# Patient Record
Sex: Female | Born: 1960 | Marital: Married | Smoking: Never smoker
Health system: Southern US, Community
[De-identification: ages and names within clinical notes are randomized; demographics above are authoritative.]

## PROBLEM LIST (undated history)

## (undated) DIAGNOSIS — E785 Hyperlipidemia, unspecified: Secondary | ICD-10-CM

## (undated) HISTORY — PX: ABDOMINAL HYSTERECTOMY: SHX81

---

## 2004-09-05 ENCOUNTER — Ambulatory Visit: Payer: Self-pay

## 2005-07-19 ENCOUNTER — Ambulatory Visit: Payer: Self-pay | Admitting: Internal Medicine

## 2006-04-04 ENCOUNTER — Ambulatory Visit: Payer: Self-pay | Admitting: Internal Medicine

## 2006-04-18 ENCOUNTER — Ambulatory Visit: Payer: Self-pay | Admitting: Internal Medicine

## 2006-05-11 ENCOUNTER — Ambulatory Visit: Payer: Self-pay | Admitting: Internal Medicine

## 2006-06-01 IMAGING — US US RENAL KIDNEY
1 series · 17 of 25 positions shown · non-contrast
Comparison: none

REASON FOR EXAM: Left flank pain
COMMENTS:

[Series 1: us renal kidney · 17 of 32 slices shown]
[im 1/32]
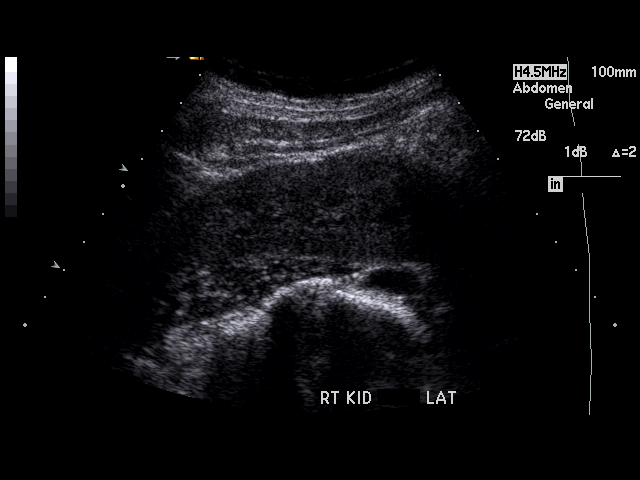
[im 3/32]
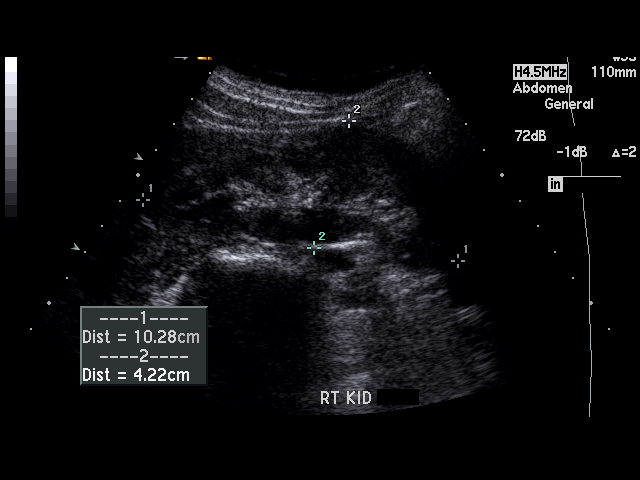
[im 4/32]
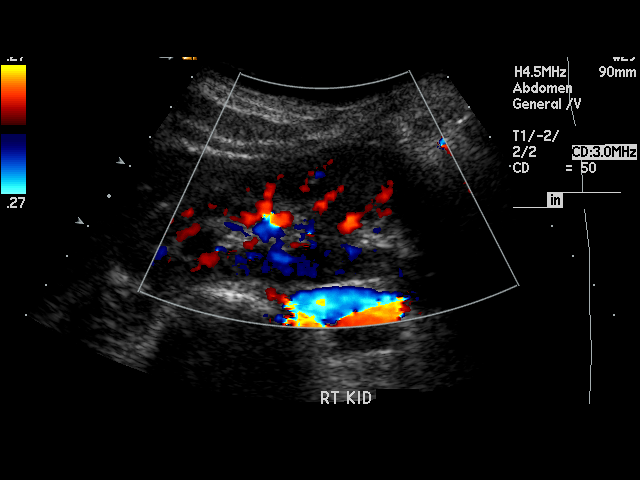
[im 7/32]
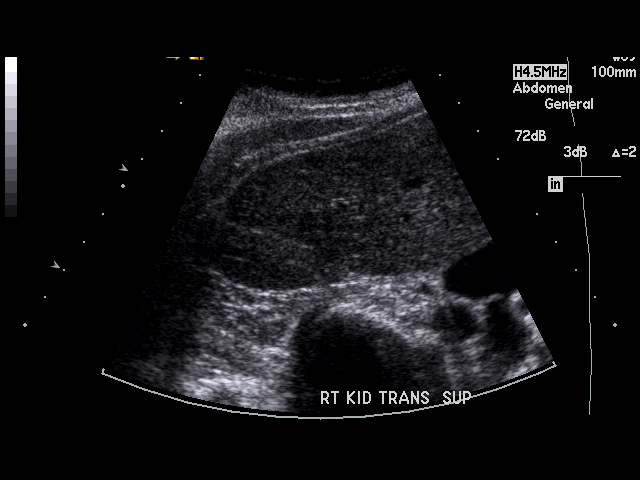
[im 8/32]
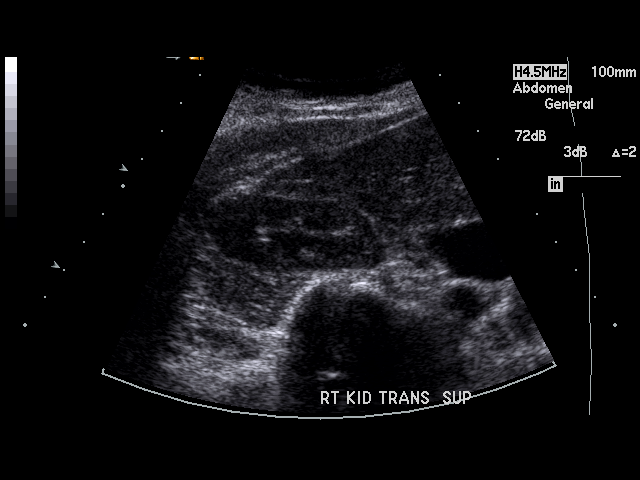
[im 11/32]
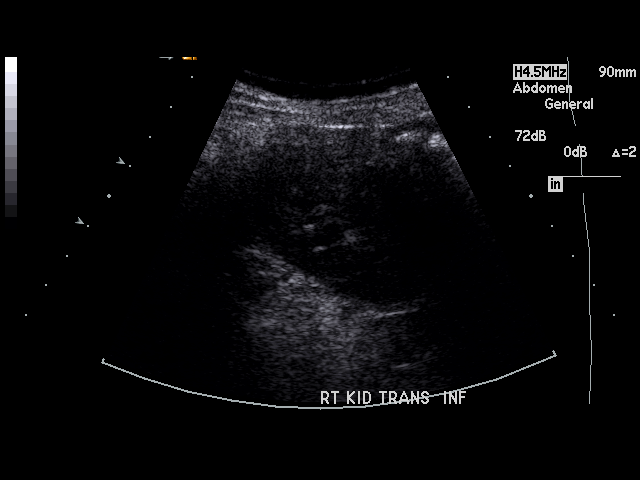
[im 12/32]
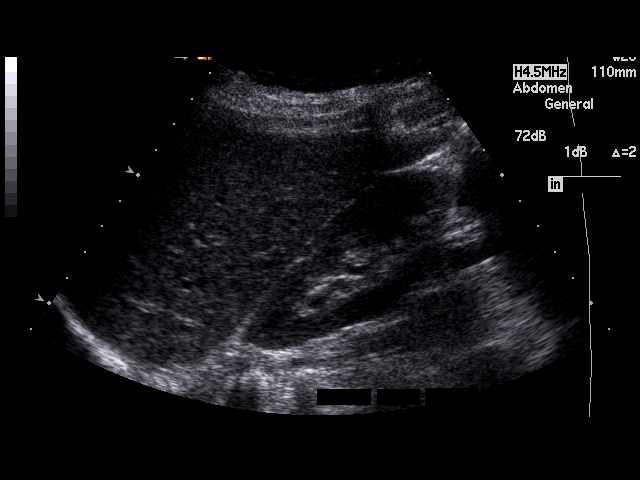
[im 15/32]
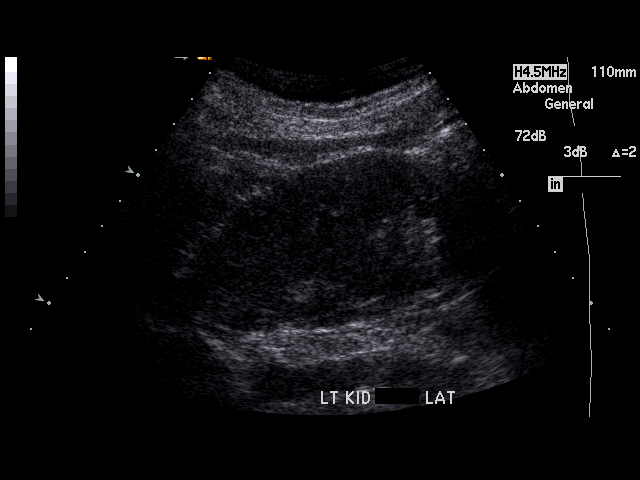
[im 16/32]
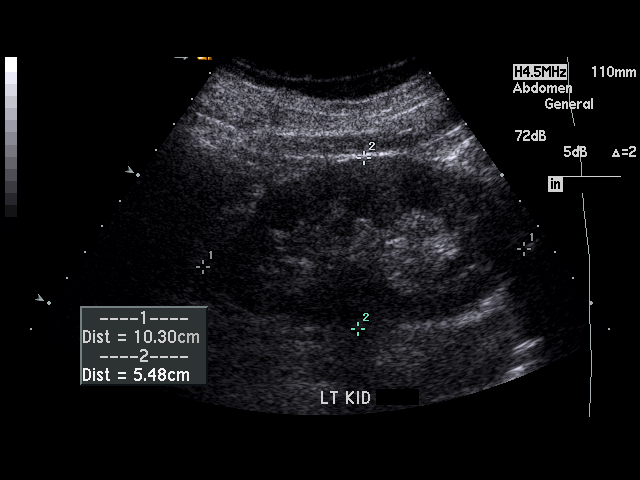
[im 17/32]
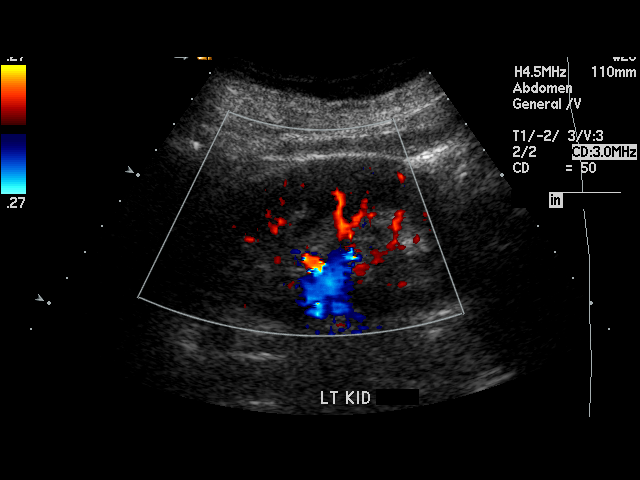
[im 20/32]
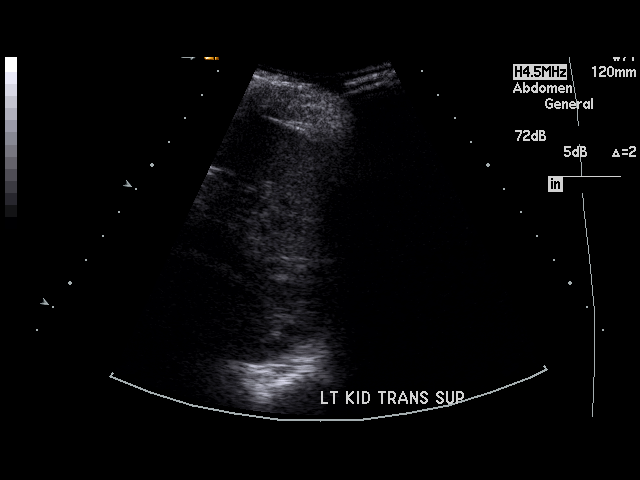
[im 21/32]
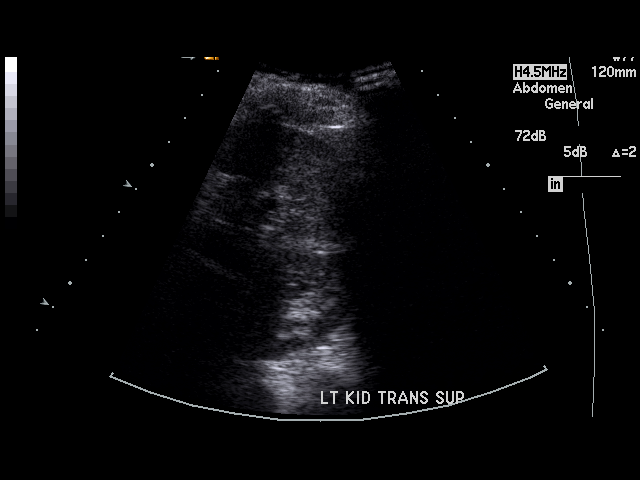
[im 24/32]
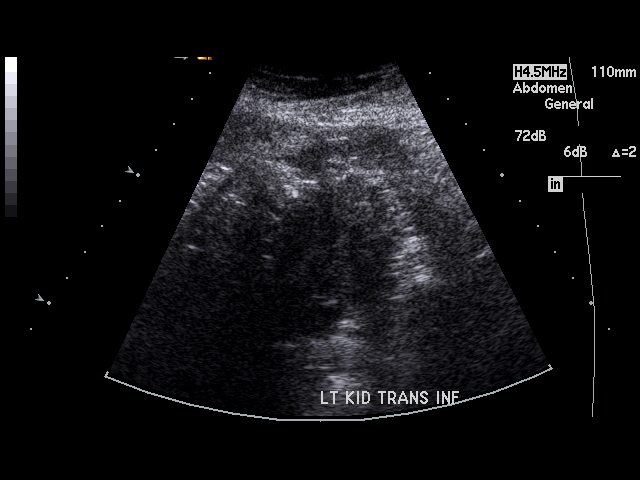
[im 25/32]
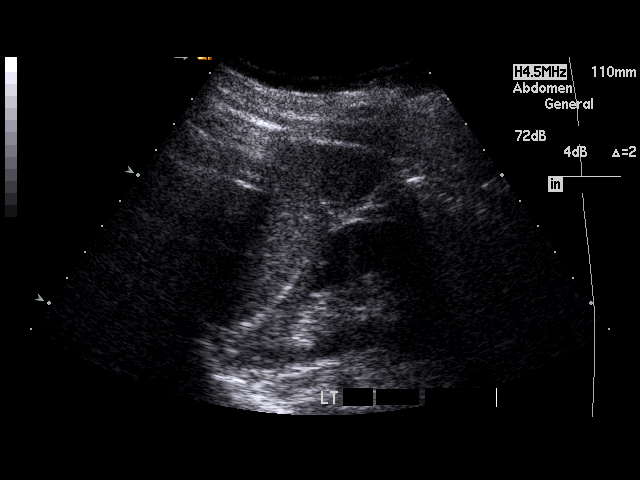
[im 28/32]
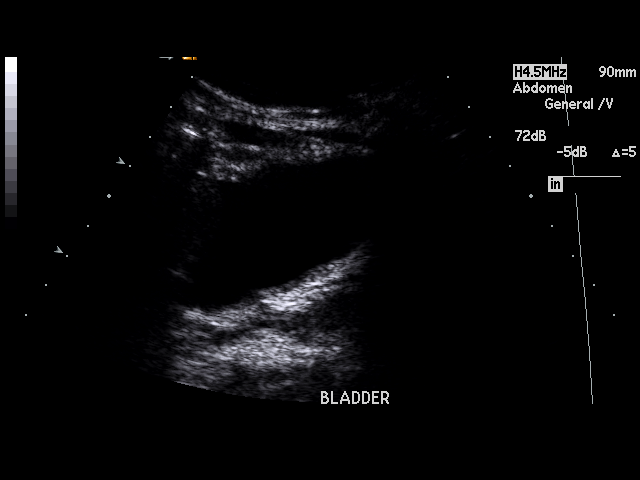
[im 29/32]
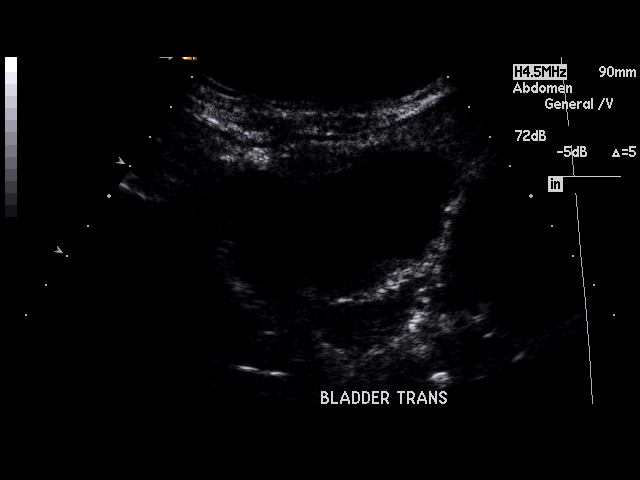
[im 32/32]
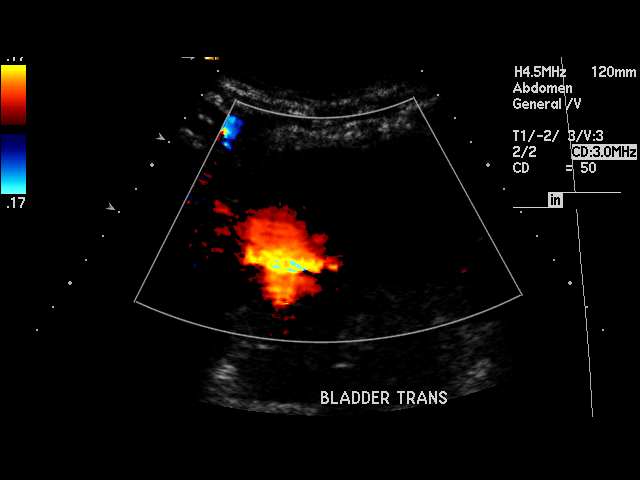

[17 of 25 positions shown; findings below may reference images not displayed]

PROCEDURE:     US  - US KIDNEY BILATERAL  - July 19, 2005 [DATE]

RESULT:          The patient is complaining of LEFT sided flank discomfort.

Both RIGHT and LEFT kidneys exhibit normal echotexture.  There is no
evidence of hydronephrosis.  The LEFT kidney measures 10.3 x 3.8 x 5.5 cm.
The RIGHT kidney measures 10.3 x 4.8 x 4.2 cm.  No perinephric fluid
collections are seen.  The urinary bladder contains a moderate amount of
urine and ureteral jets were demonstrated bilaterally.
IMPRESSION: I do not see findings on this study to explain the patient's LEFT flank
pain.  There is no evidence of hydronephrosis and bilateral ureteral jets
were demonstrated at the ureterovesical junction.  The urinary bladder did
contain a moderate amount of urine despite the patient having voided prior
to the study.

## 2007-03-24 IMAGING — CR DG CHEST 2V
1 series · 2 of 2 positions shown · non-contrast
Comparison: none

REASON FOR EXAM: Chest pain
COMMENTS:

PROCEDURE:     DXR - DXR CHEST PA (OR AP) AND LATERAL  - May 11, 2006 [DATE]
RESULT:     The mediastinum and hilar structures are normal. The lungs are
clear. The cardiovascular structures are unremarkable.

[Series 1: view not recorded · 0.17mm/px · 2 of 2 slices shown]
[im 1/2]
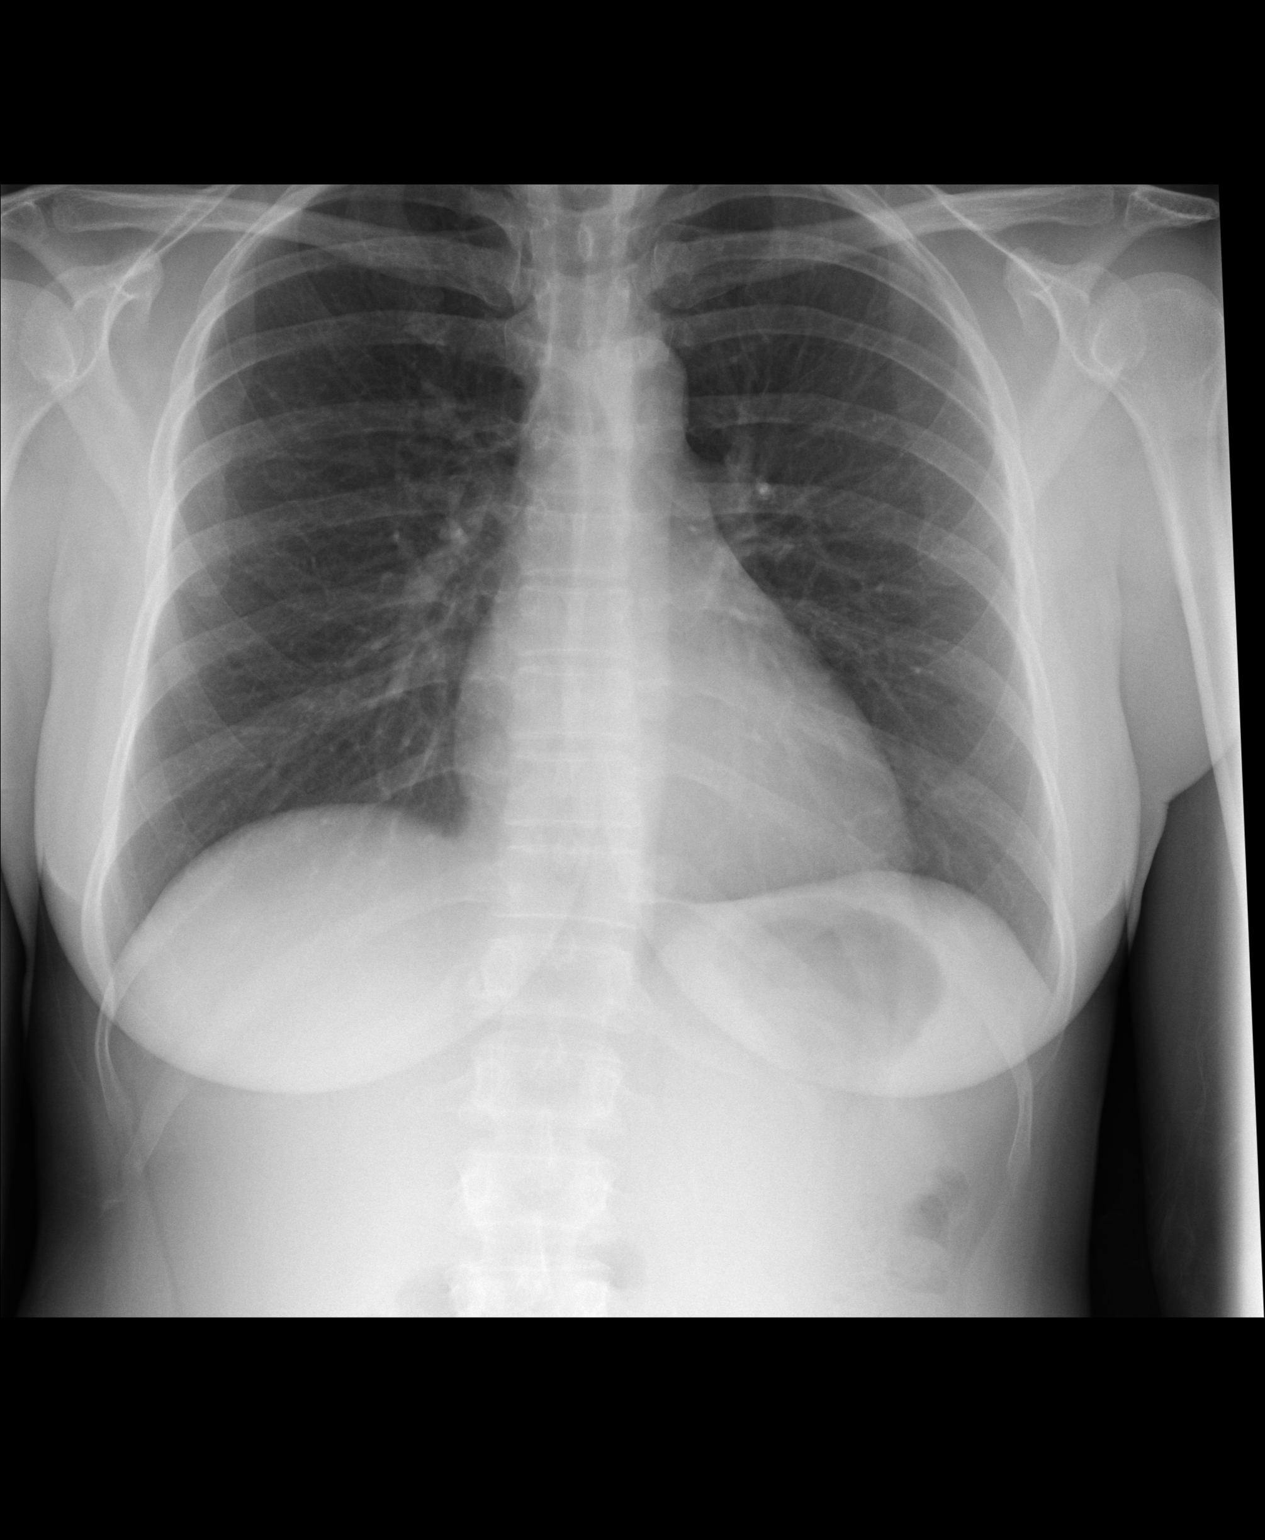
[im 2/2]
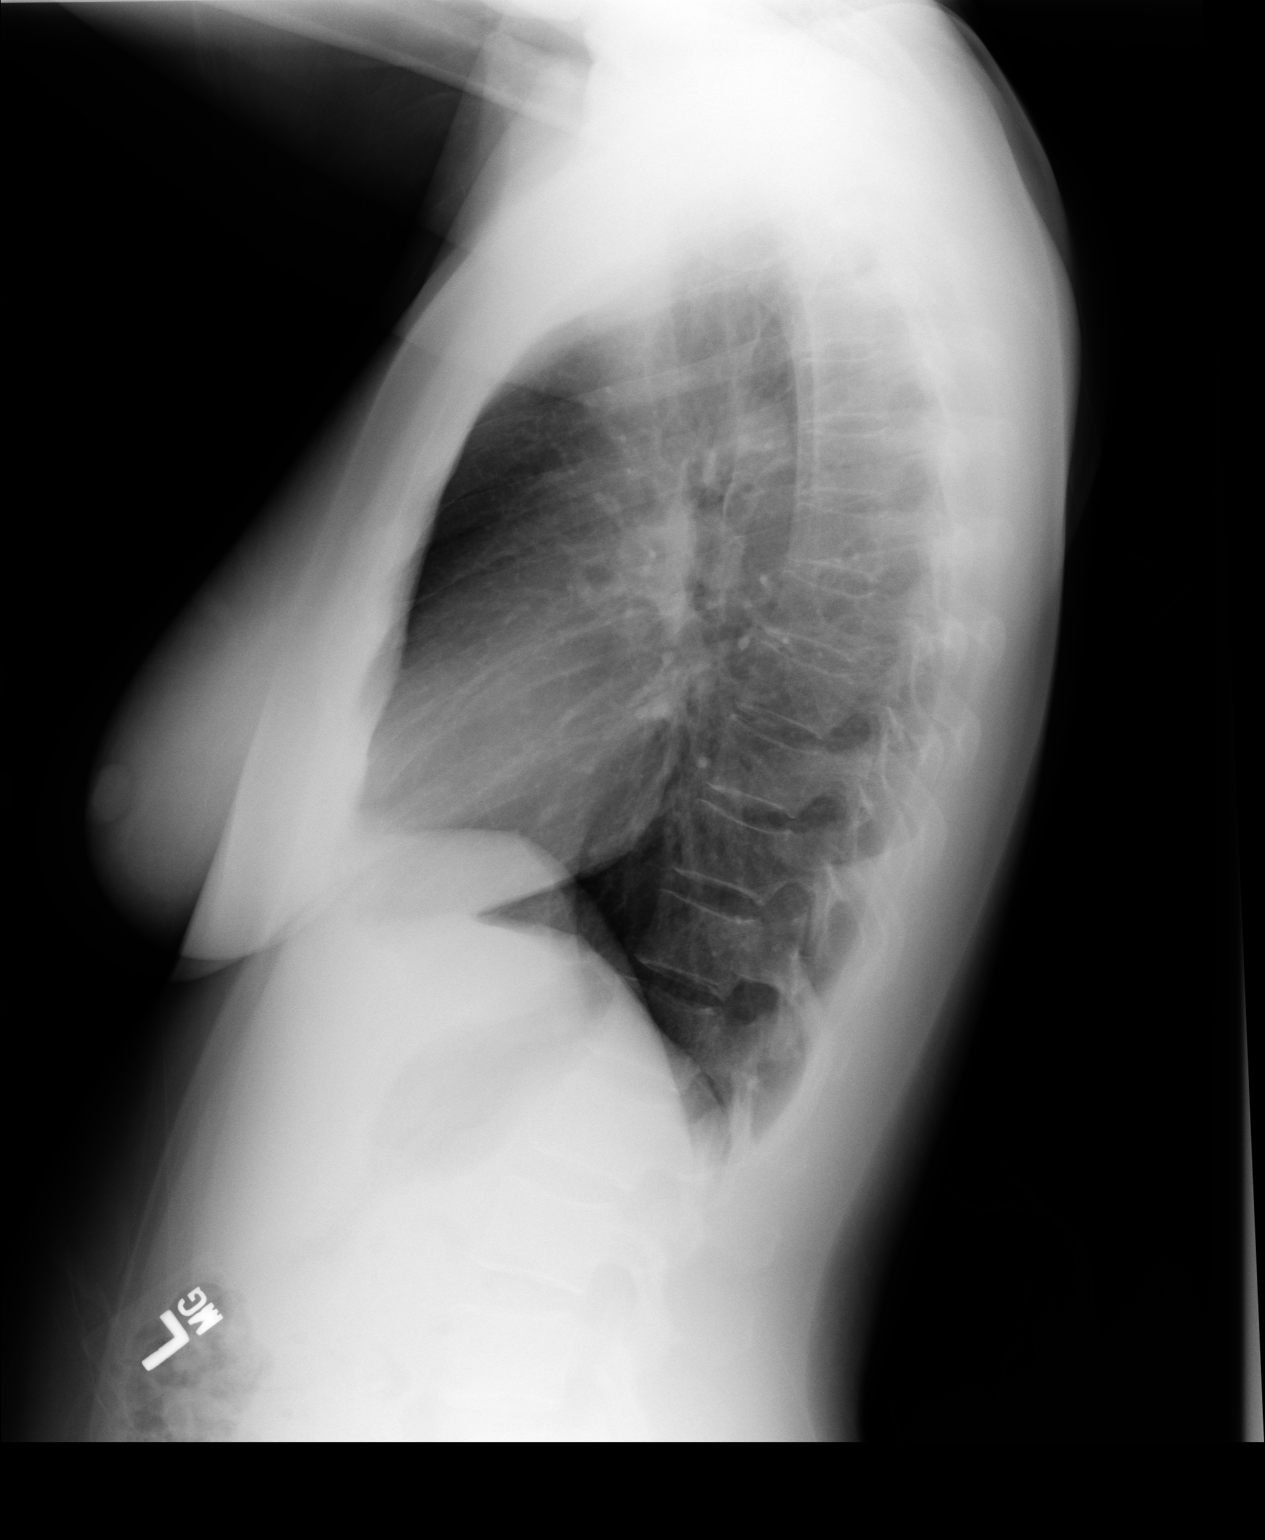

[2 of 2 positions shown; findings below may reference images not displayed]

IMPRESSION: No acute cardiopulmonary disease.

## 2014-10-07 ENCOUNTER — Ambulatory Visit: Payer: Self-pay | Admitting: Nurse Practitioner

## 2020-03-07 ENCOUNTER — Other Ambulatory Visit: Payer: Self-pay

## 2020-03-07 ENCOUNTER — Emergency Department: Payer: 59

## 2020-03-07 ENCOUNTER — Encounter: Payer: Self-pay | Admitting: Emergency Medicine

## 2020-03-07 ENCOUNTER — Emergency Department
Admission: EM | Admit: 2020-03-07 | Discharge: 2020-03-07 | Disposition: A | Discharge status: Home / Self Care | Payer: 59 | Attending: Emergency Medicine | Admitting: Emergency Medicine

## 2020-03-07 DIAGNOSIS — R079 Chest pain, unspecified: Secondary | ICD-10-CM | POA: Insufficient documentation

## 2020-03-07 DIAGNOSIS — R0602 Shortness of breath: Secondary | ICD-10-CM | POA: Insufficient documentation

## 2020-03-07 DIAGNOSIS — Z5321 Procedure and treatment not carried out due to patient leaving prior to being seen by health care provider: Secondary | ICD-10-CM | POA: Diagnosis not present

## 2020-03-07 HISTORY — DX: Hyperlipidemia, unspecified: E78.5

## 2020-03-07 LAB — BASIC METABOLIC PANEL
Anion gap: 11 (ref 5–15)
BUN: 15 mg/dL (ref 6–20)
CO2: 23 mmol/L (ref 22–32)
Calcium: 8.9 mg/dL (ref 8.9–10.3)
Chloride: 101 mmol/L (ref 98–111)
Creatinine, Ser: 0.77 mg/dL (ref 0.44–1.00)
GFR calc Af Amer: >60 mL/min (ref >60)
GFR calc non Af Amer: >60 mL/min (ref >60)
Glucose, Bld: 135 mg/dL — ABNORMAL HIGH (ref 70–99)
Potassium: 4.2 mmol/L (ref 3.5–5.1)
Sodium: 135 mmol/L (ref 135–145)

## 2020-03-07 LAB — CBC
HCT: 40.8 % (ref 36.0–46.0)
Hemoglobin: 13.5 g/dL (ref 12.0–15.0)
MCH: 30.3 pg (ref 26.0–34.0)
MCHC: 33.1 g/dL (ref 30.0–36.0)
MCV: 91.5 fL (ref 80.0–100.0)
Platelets: 252 10*3/uL (ref 150–400)
RBC: 4.46 MIL/uL (ref 3.87–5.11)
RDW: 11.9 % (ref 11.5–15.5)
WBC: 7.9 10*3/uL (ref 4.0–10.5)
nRBC: 0 % (ref 0.0–0.2)

## 2020-03-07 LAB — TROPONIN I (HIGH SENSITIVITY): Troponin I (High Sensitivity): <2 ng/L (ref <18)

## 2020-03-07 NOTE — ED Notes (Signed)
Pt to xray

## 2020-03-07 NOTE — ED Notes (Signed)
Pt and family state they are leaving. Pt informed to return for any concerns.

## 2020-03-07 NOTE — ED Triage Notes (Signed)
Pt arrived via POV with reports of left side chest pain, started in the morning, states she thought it was a muscle pain, states the pain worsened this afternoon to where she could not breathe.  Pt's family gave her 2 NTG at 805 and 810pm with no relief.  Pt unable to sit still, c/o pain with taking in a deep breath and is shaking in triage.

## 2022-11-27 ENCOUNTER — Ambulatory Visit
Admission: EM | Admit: 2022-11-27 | Discharge: 2022-11-27 | Disposition: A | Discharge status: Home / Self Care | Payer: 59 | Attending: Family Medicine | Admitting: Family Medicine

## 2022-11-27 DIAGNOSIS — N644 Mastodynia: Secondary | ICD-10-CM

## 2022-11-27 DIAGNOSIS — Z758 Other problems related to medical facilities and other health care: Secondary | ICD-10-CM

## 2022-11-27 DIAGNOSIS — Z87898 Personal history of other specified conditions: Secondary | ICD-10-CM

## 2022-11-27 NOTE — ED Triage Notes (Addendum)
Pt c/o RT breast pain x3 weeks, pt states pain has stayed the same. Pt states her last mammogram was about 10 years ago, denies any family hx of breast cancer.

## 2022-11-27 NOTE — ED Provider Notes (Signed)
[facility]    CSN: [id number] Arrival date & time: 11/27/22  1902      History   Chief Complaint Chief Complaint  Patient presents with   Breast Pain    HPI Gail Stewart is a 62 y.o. female.   HPI   Gail Stewart presents for right breast pain for the past 3 weeks. No lumps or bumps. Her last mammogram about 10 years.No family history of breast cancer. There has been no fever, change in pain with movement, nipple retraction or discharge. She has not been lifting anything heavy.     Past Medical History:  Diagnosis Date   Hyperlipidemia     There are no problems to display for this patient.   Past Surgical History:  Procedure Laterality Date   ABDOMINAL HYSTERECTOMY     CESAREAN SECTION      OB History   No obstetric history on file.      Home Medications    Prior to Admission medications   Not on File    Family History No family history on file.  Social History Social History   Tobacco Use   Smoking status: Never   Smokeless tobacco: Never     Allergies   Penicillins   Review of Systems Review of Systems: negative unless otherwise stated in HPI.      Physical Exam Triage Vital Signs ED Triage Vitals  Enc Vitals Group     BP 11/27/22 1928 (!) 153/82     Pulse Rate 11/27/22 1928 67     Resp --      Temp 11/27/22 1928 98.3 F (36.8 C)     Temp Source 11/27/22 1928 Oral     SpO2 11/27/22 1928 100 %     Weight 11/27/22 1927 122 lb (55.3 kg)     Height 11/27/22 1927 5\' 1"  (1.549 m)     Head Circumference --      Peak Flow --      Pain Score 11/27/22 1927 0     Pain Loc --      Pain Edu? --      Excl. in GC? --    No data found.  Updated Vital Signs BP (!) 153/82 (BP Location: Right Arm)   Pulse 67   Temp 98.3 F (36.8 C) (Oral)   Ht 5\' 1"  (1.549 m)   Wt 55.3 kg   SpO2 100%   BMI 23.05 kg/m   Visual Acuity Right Eye Distance:   Left Eye Distance:   Bilateral Distance:    Right Eye Near:   Left Eye  Near:    Bilateral Near:     Physical Exam GEN:     alert, well appearing female and no distress    HENT:  mucus membranes moist, no nasal discharge  EYES:   pupils equal and reactive, EOM intact NECK:  ROM at baseline  LYMPH: No palpable  supraclavicular, mammary or axillary lymphadenopathy RESP:  clear to auscultation bilaterally, no increased work of breathing  CVS:   regular rate and rhythm BREASTS: right breast normal without mass, skin or nipple changes or axillary nodes, left breast normal without mass, skin or nipple changes or axillary nodes, risk and benefit of breast self-exam was discussed, right inferior inner quadrant tenderness. EXT:   no edema  NEURO:  normal without focal findings,  speech normal, alert and oriented  Skin:   warm and dry Psych: Normal affect, appropriate speech and behavior  UC Treatments / Results  Labs (all labs ordered are listed, but only abnormal results are displayed) Labs Reviewed - No data to display  EKG  Radiology No results found.   Procedures Procedures (including critical care time)  Medications Ordered in UC Medications - No data to display  Initial Impression / Assessment and Plan / UC Course  I have reviewed the triage vital signs and the nursing notes.  Pertinent labs & imaging results that were available during my care of the patient were reviewed by me and considered in my medical decision making (see chart for details).       Patient is a 62 y.o. female  who presents for right breast pain for past 3 weeks.  Overall patient is well-appearing and afebrile.   She is hypertensive. She does not have a PCP.  BP at outside facilities in 2022-2021 showed elevated blood pressures 149/87, 162/90. She may have undiagnosed hypertension. Cardiopulmonary exam unremarkable. Recommended she obtain a PCP and she is agreeable.  CMA schedule PCP appt for June. Advised pt to keep this appointment.  Regarding her right breast pain,  the pain is not reproducible with movement. Does not appear to be MSK related. Trial 400-600 mg ibuprofen to see if this relieves her pain.  On chart review, her last mammogram was abnormal in March 2016.  There are possible asymmetries in both breasts for further evaluation.  I do not see where this was done.  I am concerned the patient may have breast cancer.  A referral was placed to the [facility] breast center.  She will likely need a diagnostic mammogram. Patient provided with contact information for this facility.   Etiology and prognosis uncertain.    ED and return precautions given and patient/guardian voiced understanding. Discussed MDM, treatment plan and plan for follow-up with patient who agrees with plan.    Final Clinical Impressions(s) / UC Diagnoses   Final diagnoses:  Breast pain, right  Does not have primary care provider  History of abnormal mammogram     Discharge Instructions      I did not feel any masses however you will need an updated diagnostic mammogram to rule out a small mass or cancer that could be causing your breast pain.  Take ibuprofen 400-600 mg as needed every 6-8 hours.   Call the [facility] at [facility] to get a mammogram.  [place], [place]  [phone]      ED Prescriptions   None    PDMP not reviewed this encounter.   Katha Cabal, DO 11/29/22 [phone]

## 2022-11-27 NOTE — Discharge Instructions (Addendum)
I did not feel any masses however you will need an updated diagnostic mammogram to rule out a small mass or cancer that could be causing your breast pain.  Take ibuprofen 400-600 mg as needed every 6-8 hours.   Call the [facility] at [facility] to get a mammogram.  [place], [place]  [phone]

## 2022-11-28 ENCOUNTER — Other Ambulatory Visit: Payer: Self-pay | Admitting: Family Medicine

## 2022-11-28 DIAGNOSIS — Z1231 Encounter for screening mammogram for malignant neoplasm of breast: Secondary | ICD-10-CM

## 2023-01-23 NOTE — Progress Notes (Unsigned)
   There were no vitals taken for this visit.   Subjective:    Patient ID: Gail Stewart, female    DOB: [birth year], 62 y.o.   MRN: [id number]  HPI: Gail Stewart is a 62 y.o. female  No chief complaint on file.  Establish care: her last physical was ***.  Medical history includes ***.  Family history includes ***.  Health maintenance ***.   Relevant past medical, surgical, family and social history reviewed and updated as indicated. Interim medical history since our last visit reviewed. Allergies and medications reviewed and updated.  Review of Systems  Constitutional: Negative for fever or weight change.  Respiratory: Negative for cough and shortness of breath.   Cardiovascular: Negative for chest pain or palpitations.  Gastrointestinal: Negative for abdominal pain, no bowel changes.  Musculoskeletal: Negative for gait problem or joint swelling.  Skin: Negative for rash.  Neurological: Negative for dizziness or headache.  No other specific complaints in a complete review of systems (except as listed in HPI above).      Objective:    There were no vitals taken for this visit.  Wt Readings from Last 3 Encounters:  11/27/22 122 lb (55.3 kg)  03/07/20 121 lb (54.9 kg)    Physical Exam  Constitutional: Patient appears well-developed and well-nourished. Obese *** No distress.  HEENT: head atraumatic, normocephalic, pupils equal and reactive to light, ears ***, neck supple, throat within normal limits Cardiovascular: Normal rate, regular rhythm and normal heart sounds.  No murmur heard. No BLE edema. Pulmonary/Chest: Effort normal and breath sounds normal. No respiratory distress. Abdominal: Soft.  There is no tenderness. Psychiatric: Patient has a normal mood and affect. behavior is normal. Judgment and thought content normal.  Results for orders placed or performed during the hospital encounter of 03/07/20  Basic metabolic panel  Result Value Ref Range   Sodium 135 135  - 145 mmol/L   Potassium 4.2 3.5 - 5.1 mmol/L   Chloride 101 98 - 111 mmol/L   CO2 23 22 - 32 mmol/L   Glucose, Bld 135 (H) 70 - 99 mg/dL   BUN 15 6 - 20 mg/dL   Creatinine, Ser 4.09 0.44 - 1.00 mg/dL   Calcium 8.9 8.9 - 81.1 mg/dL   GFR calc non Af Amer >60 >60 mL/min   GFR calc Af Amer >60 >60 mL/min   Anion gap 11 5 - 15  CBC  Result Value Ref Range   WBC 7.9 4.0 - 10.5 K/uL   RBC 4.46 3.87 - 5.11 MIL/uL   Hemoglobin 13.5 12.0 - 15.0 g/dL   HCT 91.4 78.2 - 95.6 %   MCV 91.5 80.0 - 100.0 fL   MCH 30.3 26.0 - 34.0 pg   MCHC 33.1 30.0 - 36.0 g/dL   RDW 21.3 08.6 - 57.8 %   Platelets 252 150 - 400 K/uL   nRBC 0.0 0.0 - 0.2 %  Troponin I (High Sensitivity)  Result Value Ref Range   Troponin I (High Sensitivity) <2 <18 ng/L      Assessment & Plan:   Problem List Items Addressed This Visit   None    Follow up plan: No follow-ups on file.

## 2023-01-25 ENCOUNTER — Encounter: Payer: Self-pay | Admitting: Nurse Practitioner

## 2023-01-25 ENCOUNTER — Ambulatory Visit (INDEPENDENT_AMBULATORY_CARE_PROVIDER_SITE_OTHER): Payer: 59 | Admitting: Nurse Practitioner

## 2023-01-25 ENCOUNTER — Other Ambulatory Visit: Payer: Self-pay

## 2023-01-25 VITALS — BP 124/82 | HR 74 | Temp 98.2°F | Resp 18 | Ht 59.5 in | Wt 122.8 lb

## 2023-01-25 DIAGNOSIS — Z131 Encounter for screening for diabetes mellitus: Secondary | ICD-10-CM | POA: Diagnosis not present

## 2023-01-25 DIAGNOSIS — E785 Hyperlipidemia, unspecified: Secondary | ICD-10-CM | POA: Insufficient documentation

## 2023-01-25 DIAGNOSIS — Z130 Encounter for screening for diseases of the blood and blood-forming organs and certain disorders involving the immune mechanism: Secondary | ICD-10-CM | POA: Diagnosis not present

## 2023-01-25 DIAGNOSIS — Z13220 Encounter for screening for lipoid disorders: Secondary | ICD-10-CM

## 2023-01-25 DIAGNOSIS — Z114 Encounter for screening for human immunodeficiency virus [HIV]: Secondary | ICD-10-CM

## 2023-01-25 DIAGNOSIS — Z7689 Persons encountering health services in other specified circumstances: Secondary | ICD-10-CM

## 2023-01-25 DIAGNOSIS — R7302 Impaired glucose tolerance (oral): Secondary | ICD-10-CM | POA: Insufficient documentation

## 2023-01-25 DIAGNOSIS — Z1231 Encounter for screening mammogram for malignant neoplasm of breast: Secondary | ICD-10-CM | POA: Diagnosis not present

## 2023-01-25 DIAGNOSIS — Z1159 Encounter for screening for other viral diseases: Secondary | ICD-10-CM

## 2023-01-25 DIAGNOSIS — N644 Mastodynia: Secondary | ICD-10-CM

## 2023-01-26 LAB — CBC WITH DIFFERENTIAL/PLATELET
Absolute Monocytes: 405 cells/uL (ref 200–950)
Basophils Absolute: 41 cells/uL (ref 0–200)
Basophils Relative: 0.9 %
Eosinophils Absolute: 83 cells/uL (ref 15–500)
Eosinophils Relative: 1.8 %
HCT: 43.5 % (ref 35.0–45.0)
Hemoglobin: 14.2 g/dL (ref 11.7–15.5)
Lymphs Abs: 2065 cells/uL (ref 850–3900)
MCH: 30.7 pg (ref 27.0–33.0)
MCHC: 32.6 g/dL (ref 32.0–36.0)
MCV: 94 fL (ref 80.0–100.0)
MPV: 10.8 fL (ref 7.5–12.5)
Monocytes Relative: 8.8 %
Neutro Abs: 2006 cells/uL (ref 1500–7800)
Neutrophils Relative %: 43.6 %
Platelets: 268 10*3/uL (ref 140–400)
RBC: 4.63 10*6/uL (ref 3.80–5.10)
RDW: 11.5 % (ref 11.0–15.0)
Total Lymphocyte: 44.9 %
WBC: 4.6 10*3/uL (ref 3.8–10.8)

## 2023-01-26 LAB — COMPLETE METABOLIC PANEL WITH GFR
AG Ratio: 1.6 (calc) (ref 1.0–2.5)
ALT: 11 U/L (ref 6–29)
AST: 13 U/L (ref 10–35)
Albumin: 4.3 g/dL (ref 3.6–5.1)
Alkaline phosphatase (APISO): 67 U/L (ref 37–153)
BUN: 14 mg/dL (ref 7–25)
CO2: 25 mmol/L (ref 20–32)
Calcium: 9.6 mg/dL (ref 8.6–10.4)
Chloride: 105 mmol/L (ref 98–110)
Creat: 0.78 mg/dL (ref 0.50–1.05)
Globulin: 2.7 g/dL (calc) (ref 1.9–3.7)
Glucose, Bld: 84 mg/dL (ref 65–99)
Potassium: 4.7 mmol/L (ref 3.5–5.3)
Sodium: 140 mmol/L (ref 135–146)
Total Bilirubin: 0.5 mg/dL (ref 0.2–1.2)
Total Protein: 7 g/dL (ref 6.1–8.1)
eGFR: 86 mL/min/{1.73_m2} (ref >=60)

## 2023-01-26 LAB — HEPATITIS C ANTIBODY: Hepatitis C Ab: NONREACTIVE

## 2023-01-26 LAB — HIV ANTIBODY (ROUTINE TESTING W REFLEX): HIV 1&2 Ab, 4th Generation: NONREACTIVE

## 2023-01-26 LAB — HEMOGLOBIN A1C
Hgb A1c MFr Bld: 5.5 % of total Hgb (ref <5.7)
Mean Plasma Glucose: 111 mg/dL
eAG (mmol/L): 6.2 mmol/L

## 2023-01-26 LAB — LIPID PANEL
Cholesterol: 260 mg/dL — ABNORMAL HIGH (ref <200)
HDL: 72 mg/dL (ref >=50)
LDL Cholesterol (Calc): 174 mg/dL (calc) — ABNORMAL HIGH
Non-HDL Cholesterol (Calc): 188 mg/dL (calc) — ABNORMAL HIGH (ref <130)
Total CHOL/HDL Ratio: 3.6 (calc) (ref <5.0)
Triglycerides: 47 mg/dL (ref <150)

## 2023-02-15 ENCOUNTER — Ambulatory Visit: Payer: 59

## 2023-02-22 ENCOUNTER — Other Ambulatory Visit: Payer: Self-pay | Admitting: Nurse Practitioner

## 2023-02-22 ENCOUNTER — Telehealth: Payer: Self-pay | Admitting: Nurse Practitioner

## 2023-02-22 DIAGNOSIS — N644 Mastodynia: Secondary | ICD-10-CM

## 2023-02-22 NOTE — Telephone Encounter (Signed)
Copied from CRM [id number]. Topic: General - Other >> Feb 22, 2023  4:24 PM Turkey B wrote: Reason for CRM: pt's husband called in checking status of referral for mamogram. I let him no its still being worked on. He say the appt they had scheduled for Aug 8. I let him know has to give more time for referral to go thru

## 2023-03-08 ENCOUNTER — Other Ambulatory Visit: Payer: 59

## 2023-03-08 ENCOUNTER — Ambulatory Visit: Payer: 59

## 2023-03-09 ENCOUNTER — Ambulatory Visit
Admission: RE | Admit: 2023-03-09 | Discharge: 2023-03-09 | Disposition: A | Discharge status: Home / Self Care | Payer: 59 | Source: Ambulatory Visit | Attending: Nurse Practitioner | Admitting: Nurse Practitioner

## 2023-03-09 DIAGNOSIS — N644 Mastodynia: Secondary | ICD-10-CM
